# Patient Record
Sex: Male | Born: 1990 | Race: Black or African American | Hispanic: No | Marital: Single | Smoking: Never smoker
Health system: Southern US, Community
[De-identification: ages and names within clinical notes are randomized; demographics above are authoritative.]

---

## 2014-03-27 HISTORY — PX: KNEE CARTILAGE SURGERY: SHX688

## 2015-08-02 ENCOUNTER — Emergency Department (HOSPITAL_COMMUNITY)
Admission: EM | Admit: 2015-08-02 | Discharge: 2015-08-02 | Disposition: A | Discharge status: Home / Self Care | Payer: Commercial Managed Care - PPO | Attending: Emergency Medicine | Admitting: Emergency Medicine

## 2015-08-02 ENCOUNTER — Emergency Department (HOSPITAL_COMMUNITY): Payer: Commercial Managed Care - PPO

## 2015-08-02 ENCOUNTER — Encounter (HOSPITAL_COMMUNITY): Payer: Self-pay | Admitting: Emergency Medicine

## 2015-08-02 DIAGNOSIS — S3992XA Unspecified injury of lower back, initial encounter: Secondary | ICD-10-CM | POA: Insufficient documentation

## 2015-08-02 DIAGNOSIS — Y92410 Unspecified street and highway as the place of occurrence of the external cause: Secondary | ICD-10-CM | POA: Insufficient documentation

## 2015-08-02 DIAGNOSIS — M545 Low back pain, unspecified: Secondary | ICD-10-CM

## 2015-08-02 DIAGNOSIS — S29002A Unspecified injury of muscle and tendon of back wall of thorax, initial encounter: Secondary | ICD-10-CM | POA: Insufficient documentation

## 2015-08-02 DIAGNOSIS — Y9389 Activity, other specified: Secondary | ICD-10-CM | POA: Insufficient documentation

## 2015-08-02 DIAGNOSIS — Y998 Other external cause status: Secondary | ICD-10-CM | POA: Diagnosis not present

## 2015-08-02 MED ORDER — NAPROXEN 500 MG PO TABS: 500.0000 mg | ORAL_TABLET | Freq: Two times a day (BID) | ORAL | Status: DC

## 2015-08-02 MED ORDER — CYCLOBENZAPRINE HCL 10 MG PO TABS: 10.0000 mg | ORAL_TABLET | Freq: Two times a day (BID) | ORAL | Status: DC | PRN

## 2015-08-02 MED ORDER — IBUPROFEN 400 MG PO TABS
800.0000 mg | ORAL_TABLET | Freq: Once | ORAL | Status: AC
  Administered 2015-08-02: 800 mg via ORAL
  Filled 2015-08-02: qty 2
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
## 2015-08-02 NOTE — Discharge Instructions (Signed)
Do not take the muscle relaxant if driving as it will make you sleepy. Follow up with Dr. Roda ShuttersXu if symptoms persist.

## 2015-08-02 NOTE — ED Notes (Signed)
Patient verbalized understanding of discharge instructions and denies any further needs or questions at this time. VS stable. Patient ambulatory with steady gait.  
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
## 2020-09-01 ENCOUNTER — Ambulatory Visit
Admission: RE | Admit: 2020-09-01 | Discharge: 2020-09-01 | Disposition: A | Discharge status: Home / Self Care | Payer: Commercial Managed Care - PPO | Source: Ambulatory Visit | Attending: Student | Admitting: Student

## 2020-09-01 ENCOUNTER — Other Ambulatory Visit: Payer: Self-pay

## 2020-09-01 VITALS — BP 145/78 | HR 98 | Temp 98.7°F | Resp 16

## 2020-09-01 DIAGNOSIS — R369 Urethral discharge, unspecified: Secondary | ICD-10-CM | POA: Insufficient documentation

## 2020-09-01 DIAGNOSIS — Z113 Encounter for screening for infections with a predominantly sexual mode of transmission: Secondary | ICD-10-CM | POA: Insufficient documentation

## 2020-09-01 LAB — POCT URINALYSIS DIP (MANUAL ENTRY)
Bilirubin, UA: NEGATIVE
Blood, UA: NEGATIVE
Glucose, UA: NEGATIVE mg/dL
Ketones, POC UA: NEGATIVE mg/dL
Leukocytes, UA: NEGATIVE
Nitrite, UA: NEGATIVE
Protein Ur, POC: NEGATIVE mg/dL
Spec Grav, UA: >=1.03 — AB
Urobilinogen, UA: 0.2 U/dL
pH, UA: 6

## 2020-09-01 MED ORDER — CEFTRIAXONE SODIUM 500 MG IJ SOLR
500.0000 mg | Freq: Once | INTRAMUSCULAR | Status: AC
  Administered 2020-09-01: 500 mg via INTRAMUSCULAR

## 2020-09-01 MED ORDER — DOXYCYCLINE HYCLATE 100 MG PO CAPS: 100.0000 mg | ORAL_CAPSULE | Freq: Two times a day (BID) | ORAL | 0 refills | Status: AC

## 2020-09-01 NOTE — Discharge Instructions (Addendum)
-  Start the antibiotic, doxycycline twice daily for 7 days.  This is the treatment for chlamydia. -We also administered an antibiotic called Rocephin today that is the treatment for gonorrhea. -We are testing you for gonorrhea, chlamydia, trichomonas, HIV, syphilis.  If any of this is positive, we will call you and can send additional treatment if necessary. -Abstain for 7 days to ensure you are no longer contagious. -Seek additional immediate medical attention if you develop new symptoms like abdominal pain, back pain, fever/chills, worsening of the penile symptoms despite treatment.

## 2020-09-01 NOTE — ED Provider Notes (Addendum)
[facility]    CSN: [id number] Arrival date & time: 09/01/20  1641      History   Chief Complaint Chief Complaint  Patient presents with  . SEXUALLY TRANSMITTED DISEASE    HPI Tony Carrillo is a 30 y.o. male presenting with white penile discharge and dysuria x5 days.  Medical history penile discharge. Hx STI 2016 but he is unsure while one. Same male partner. Denies hematuria, frequency, urgency, back pain, n/v/d/abd pain, fevers/chills, abdnormal  Penile lesions, penile pain, testicular swelling/pain.   HPI  History reviewed. No pertinent past medical history.  There are no problems to display for this patient.   History reviewed. No pertinent surgical history.     Home Medications    Prior to Admission medications   Medication Sig Start Date End Date Taking? Authorizing Provider  doxycycline (VIBRAMYCIN) 100 MG capsule Take 1 capsule (100 mg total) by mouth 2 (two) times daily for 7 days. 09/01/20 09/08/20 Yes Rhys Martini, PA-C  cyclobenzaprine (FLEXERIL) 10 MG tablet Take 1 tablet (10 mg total) by mouth 2 (two) times daily as needed for muscle spasms. 08/02/15   Janne Napoleon, NP  naproxen (NAPROSYN) 500 MG tablet Take 1 tablet (500 mg total) by mouth 2 (two) times daily. 08/02/15   Janne Napoleon, NP    Family History History reviewed. No pertinent family history.  Social History Social History   Tobacco Use  . Smoking status: Never Smoker  Substance Use Topics  . Alcohol use: Yes    Comment: occ  . Drug use: No     Allergies   Patient has no known allergies.   Review of Systems Review of Systems  Constitutional: Negative for chills and fever.  HENT: Negative for sore throat.   Eyes: Negative for pain and redness.  Respiratory: Negative for shortness of breath.   Cardiovascular: Negative for chest pain.  Gastrointestinal: Negative for abdominal pain, diarrhea, nausea and vomiting.  Genitourinary: Positive for penile discharge. Negative  for decreased urine volume, difficulty urinating, dysuria, flank pain, frequency, genital sores, hematuria, penile pain, penile swelling, scrotal swelling, testicular pain and urgency.  Musculoskeletal: Negative for back pain.  Skin: Negative for rash.  All other systems reviewed and are negative.    Physical Exam Triage Vital Signs ED Triage Vitals  Enc Vitals Group     BP 09/01/20 1738 (!) 145/78     Pulse Rate 09/01/20 1738 98     Resp 09/01/20 1738 16     Temp 09/01/20 1738 98.7 F (37.1 C)     Temp Source 09/01/20 1738 Oral     SpO2 09/01/20 1738 98 %     Weight --      Height --      Head Circumference --      Peak Flow --      Pain Score 09/01/20 1713 0     Pain Loc --      Pain Edu? --      Excl. in GC? --    No data found.  Updated Vital Signs BP (!) 145/78 (BP Location: Right Arm)   Pulse 98   Temp 98.7 F (37.1 C) (Oral)   Resp 16   SpO2 98%   Visual Acuity Right Eye Distance:   Left Eye Distance:   Bilateral Distance:    Right Eye Near:   Left Eye Near:    Bilateral Near:     Physical Exam Vitals reviewed.  Constitutional:  General: He is not in acute distress.    Appearance: Normal appearance. He is not ill-appearing.  HENT:     Head: Normocephalic and atraumatic.     Mouth/Throat:     Mouth: Mucous membranes are moist.     Comments: Moist mucous membranes Eyes:     Extraocular Movements: Extraocular movements intact.     Pupils: Pupils are equal, round, and reactive to light.  Cardiovascular:     Rate and Rhythm: Normal rate and regular rhythm.     Heart sounds: Normal heart sounds.  Pulmonary:     Effort: Pulmonary effort is normal.     Breath sounds: Normal breath sounds. No wheezing, rhonchi or rales.  Abdominal:     General: Bowel sounds are normal. There is no distension.     Palpations: Abdomen is soft. There is no mass.     Tenderness: There is no abdominal tenderness. There is no right CVA tenderness, left CVA tenderness,  guarding or rebound.  Genitourinary:    Comments: deferred Skin:    General: Skin is warm.     Capillary Refill: Capillary refill takes less than 2 seconds.     Comments: Good skin turgor  Neurological:     General: No focal deficit present.     Mental Status: He is alert and oriented to person, place, and time.  Psychiatric:        Mood and Affect: Mood normal.        Behavior: Behavior normal.      UC Treatments / Results  Labs (all labs ordered are listed, but only abnormal results are displayed) Labs Reviewed  POCT URINALYSIS DIP (MANUAL ENTRY) - Abnormal; Notable for the following components:      Result Value   Spec Grav, UA >=1.030 (*)    All other components within normal limits  RPR  HIV ANTIBODY (ROUTINE TESTING W REFLEX)  CYTOLOGY, (ORAL, ANAL, URETHRAL) ANCILLARY ONLY    EKG   Radiology No results found.  Procedures Procedures (including critical care time)  Medications Ordered in UC Medications  cefTRIAXone (ROCEPHIN) injection 500 mg (has no administration in time range)    Initial Impression / Assessment and Plan / UC Course  I have reviewed the triage vital signs and the nursing notes.  Pertinent labs & imaging results that were available during my care of the patient were reviewed by me and considered in my medical decision making (see chart for details).     This patient is a 30 year old male presenting with penile discharge.  Afebrile, nontachycardic.  No abdominal pain or CVAT.  Self swab sent for gonorrhea, committee, trichomonas.  Planned to send for HIV, syphilis but were unable to collect this to patient veins.  Rocephin administered today.  Doxycycline sent.  safe sex precautions.  ED return precautions discussed.  Final Clinical Impressions(s) / UC Diagnoses   Final diagnoses:  Routine screening for STI (sexually transmitted infection)  Penile discharge     Discharge Instructions     -Start the antibiotic, doxycycline  twice daily for 7 days.  This is the treatment for chlamydia. -We also administered an antibiotic called Rocephin today that is the treatment for gonorrhea. -We are testing you for gonorrhea, chlamydia, trichomonas, HIV, syphilis.  If any of this is positive, we will call you and can send additional treatment if necessary. -Abstain for 7 days to ensure you are no longer contagious. -Seek additional immediate medical attention if you develop new symptoms like abdominal pain,  back pain, fever/chills, worsening of the penile symptoms despite treatment.    ED Prescriptions    Medication Sig Dispense Auth. Provider   doxycycline (VIBRAMYCIN) 100 MG capsule Take 1 capsule (100 mg total) by mouth 2 (two) times daily for 7 days. 14 capsule Rhys Martini, PA-C     PDMP not reviewed this encounter.   Rhys Martini, PA-C 09/01/20 Nelson Chimes, PA-C 09/01/20 [phone]

## 2020-09-01 NOTE — ED Triage Notes (Signed)
Pt having white discharge with some discomfort on the head of his penis. No burning with uriantion

## 2020-09-03 LAB — CYTOLOGY, (ORAL, ANAL, URETHRAL) ANCILLARY ONLY
Chlamydia: NEGATIVE
Comment: NEGATIVE
Comment: NEGATIVE
Comment: NORMAL
Neisseria Gonorrhea: NEGATIVE
Trichomonas: NEGATIVE

## 2020-09-21 ENCOUNTER — Telehealth: Payer: Self-pay | Admitting: Physician Assistant

## 2020-09-21 DIAGNOSIS — R369 Urethral discharge, unspecified: Secondary | ICD-10-CM

## 2020-09-21 MED ORDER — FLUCONAZOLE 150 MG PO TABS: 150.0000 mg | ORAL_TABLET | Freq: Once | ORAL | 0 refills | Status: AC

## 2020-09-21 NOTE — Progress Notes (Signed)
Virtual Visit Consent   Tony Carrillo, you are scheduled for a virtual visit with a [facility] provider today.     Just as with appointments in the office, your consent must be obtained to participate.  Your consent will be active for this visit and any virtual visit you may have with one of our providers in the next 365 days.     If you have a MyChart account, a copy of this consent can be sent to you electronically.  All virtual visits are billed to your insurance company just like a traditional visit in the office.    As this is a virtual visit, video technology does not allow for your provider to perform a traditional examination.  This may limit your provider's ability to fully assess your condition.  If your provider identifies any concerns that need to be evaluated in person or the need to arrange testing (such as labs, EKG, etc.), we will make arrangements to do so.     Although advances in technology are sophisticated, we cannot ensure that it will always work on either your end or our end.  If the connection with a video visit is poor, the visit may have to be switched to a telephone visit.  With either a video or telephone visit, we are not always able to ensure that we have a secure connection.     I need to obtain your verbal consent now.   Are you willing to proceed with your visit today?    [name] has provided verbal consent on 09/21/2020 for a virtual visit (video or telephone).   [name], [place]   Date: 09/21/2020 4:47 PM   Virtual Visit via Video Note   I, [name], connected with Gerritt Galentine ([id number], 1990-06-03) on 09/21/20 at  4:30 PM EDT by a video-enabled telemedicine application and verified that I am speaking with the correct person using two identifiers.  Location: Patient: Virtual Visit Location Patient: Home Provider: Virtual Visit Location Provider: Home Office   I discussed the limitations of evaluation and management by  telemedicine and the availability of in person appointments. The patient expressed understanding and agreed to proceed.    History of Present Illness: Tony Carrillo is a 30 y.o. who identifies as a male who was assigned male at birth, and is being seen today for penile discharge x 2 days. Patient states this initially occurred a few weeks ago. Began to develop a white discharge and itching around the tip of his penis. As such [facility] treated for suspect gonorrhea and chlamydia with IM Roecphin and a course of Doxycycline. UA was obtained and unremarkable for UTI. Penile swab was performed by patient (self-swab) and negative for gonorrhea, chlamydia and trichomonas. States symptoms seems to ease up and go away but have now recurred in the past couple of days. Denies fever, chills, malaise. Denies dysuria, urgency, frequency, pain, scrotal/testicular pain or swelling. Just itching at the tip of penis with mild redness and scant white discharge. Is unsure of what to do. He is sexually active with a monogamous male partner who has been asymptomatic. Notes she got tested after his symptoms started and she was negative for all STI. Is not active with any other individual. Is circumcised.   HPI: HPI  Problems: There are no problems to display for this patient.   Allergies: No Known Allergies Medications: No current outpatient medications on file.  Observations/Objective: Patient  is well-developed, well-nourished in no acute distress.  Resting comfortably at home.  Head is normocephalic, atraumatic.  No labored breathing. Speech is clear and coherent with logical content.  Patient is alert and oriented at baseline.  Unable to examine area due to video breaking up and lack of chaperone  Assessment and Plan: 1. Penile discharge Scant. White. Associated with itching and mild inflammation of glans. No other signs or symptoms present. Was treated empirically for gc/chlamydia  but all testing was negative. However this was self swab (confirmed by chart review). Giving descriptors or symptoms and negative prior testing this does raise concern for potential mild candidal balanitis. Will Rx diflucan. Discussed want him to get re-evaluation in person with repeat STI testing, performed by the clinician, just to verify no concern for false-negative initially.   Follow Up Instructions: I discussed the assessment and treatment plan with the patient. The patient was provided an opportunity to ask questions and all were answered. The patient agreed with the plan and demonstrated an understanding of the instructions.  A copy of instructions were sent to the patient via MyChart.  The patient was advised to call back or seek an in-person evaluation if the symptoms worsen or if the condition fails to improve as anticipated.  Time:  I spent 12 minutes with the patient via telehealth technology discussing the above problems/concerns.    [name], PA-C

## 2020-09-21 NOTE — Patient Instructions (Signed)
  Lenord Carbo, thank you for joining [name], PA-C for today's virtual visit.  While this provider is not your primary care provider (PCP), if your PCP is located in our provider database this encounter information will be shared with them immediately following your visit.  Consent: (Patient) Tony Carrillo provided verbal consent for this virtual visit at the beginning of the encounter.  Current Medications: No current outpatient medications on file.   Medications ordered in this encounter:  No orders of the defined types were placed in this encounter.    *If you need refills on other medications prior to your next appointment, please contact your pharmacy*  Follow-Up: Call back or seek an in-person evaluation if the symptoms worsen or if the condition fails to improve as anticipated.  Other Instructions Please take the Diflucan as directed for concerns of mild candidal balanitis (yeast infection). Keep skin clean and dry. Wear breathable underwear -- cotton.  I want you to follow-up at local urgent cares (see below) to get repeat testing (no self swabs) to truly rule out STI. Refrain from sexual activity until this is handled.    If you have been instructed to have an in-person evaluation today at a local Urgent Care facility, please use the link below. It will take you to a list of all of our available [facility], including address, phone number and hours of operation. Please do not delay care.  [facility]  If you or a family member do not have a primary care provider, use the link below to schedule a visit and establish care. When you choose a [facility] primary care physician or advanced practice provider, you gain a long-term partner in health. Find a Primary Care Provider  Learn more about [facility] in-office and virtual care options: [facility] - Get Care Now

## 2020-09-21 NOTE — Progress Notes (Signed)
Based on what you shared with me, I feel your condition warrants further evaluation and I recommend that you be seen in a face to face visit.  Giving these symptoms you need repeat evaluation and repeat testing to determine cause of symptoms so the proper treatment can be given. It is above the scope of what we are allowed to do via e-visit.    NOTE: There will be NO CHARGE for this eVisit   If you are having a true medical emergency please call 911.      For an urgent face to face visit, [facility] has six urgent care centers for your convenience:     [facility] at [facility] Directions [phone] [address] [place], [place] [address]    [facility] Health Urgent Care Center [facility]) Get Driving Directions [phone] [address] [place], [place] [address]  [facility] Urgent Care Center [facility]) Get Driving Directions [phone] [address] [place],  [place]  [address]  [facility] Urgent Care at [facility] Get Driving Directions [phone] [address] [place], [place] [address]   [facility] Urgent Care at [facility] Get Driving Directions  [phone] [address] [place], [place] [address]   [facility] Urgent Care at [facility] Directions [phone] [address] [place], [place] [address]  Your MyChart E-visit questionnaire answers were reviewed by a board certified advanced clinical practitioner to complete your personal care plan based on your specific symptoms.  Thank you for using e-Visits.

## 2020-09-24 ENCOUNTER — Ambulatory Visit
Admission: RE | Admit: 2020-09-24 | Discharge: 2020-09-24 | Disposition: A | Discharge status: Home / Self Care | Payer: Self-pay | Source: Ambulatory Visit | Attending: Family Medicine | Admitting: Family Medicine

## 2020-09-24 ENCOUNTER — Other Ambulatory Visit: Payer: Self-pay

## 2020-09-24 VITALS — BP 154/91 | HR 76 | Temp 98.0°F | Resp 18

## 2020-09-24 DIAGNOSIS — R369 Urethral discharge, unspecified: Secondary | ICD-10-CM

## 2020-09-24 DIAGNOSIS — N342 Other urethritis: Secondary | ICD-10-CM

## 2020-09-24 LAB — POCT URINALYSIS DIP (MANUAL ENTRY)
Bilirubin, UA: NEGATIVE
Glucose, UA: NEGATIVE mg/dL
Ketones, POC UA: NEGATIVE mg/dL
Nitrite, UA: NEGATIVE
Spec Grav, UA: 1.025 (ref 1.010–1.025)
Urobilinogen, UA: 0.2 U/dL
pH, UA: 7 (ref 5.0–8.0)

## 2020-09-24 MED ORDER — AZITHROMYCIN 500 MG PO TABS
ORAL_TABLET | ORAL | 0 refills | Status: DC
Start: 2020-09-24 — End: 2020-10-01

## 2020-09-24 NOTE — ED Triage Notes (Signed)
Patient c/o penile discharge x 4 day.    Patient endorses previous episode of symptoms 2 weeks ago, then symptoms subsided and then reoccurred after office visit at this clinic per patient statement.   Patient endorses "white" discharge. Patient endorses " a little itching".   Patient denies testicle pain and dysuria.   Patient endorses being treated for Gonorrhea 2 weeks ago and reports negative test results.

## 2020-09-24 NOTE — ED Provider Notes (Signed)
[facility]    CSN: [id number] Arrival date & time: 09/24/20  1648      History   Chief Complaint Chief Complaint  Patient presents with   Penile Discharge    HPI Tony Carrillo is a 30 y.o. male.   Reports whitish penile discharge for the last 4 days.  Reports that this happened about 2 weeks ago when he was seen in this office and empirically treated for STDs with Rocephin, doxycycline.  Reports that after the doxycycline he got little better and then came back.  Reports that he has since had a video visit and was diagnosed with yeast, was treated with Diflucan.  Reports that this did not make a difference.  Denies known STD exposure.  Denies foul odor, burning with urination, abdominal pain, flank pain, chills, fevers, other symptoms.  ROS per HPI  The history is provided by the patient.  Penile Discharge   History reviewed. No pertinent past medical history.  There are no problems to display for this patient.   Past Surgical History:  Procedure Laterality Date   KNEE CARTILAGE SURGERY Right 2008   KNEE CARTILAGE SURGERY Left 2016       Home Medications    Prior to Admission medications   Medication Sig Start Date End Date Taking? Authorizing Provider  azithromycin (ZITHROMAX) 500 MG tablet Take 2 tablets on day one, and one tablet for the next 4 days 09/24/20  Yes Moshe Cipro, NP    Family History History reviewed. No pertinent family history.  Social History Social History   Tobacco Use   Smoking status: Never    Passive exposure: Never   Smokeless tobacco: Never  Vaping Use   Vaping Use: Never used  Substance Use Topics   Alcohol use: Yes    Comment: occ   Drug use: No     Allergies   Patient has no known allergies.   Review of Systems Review of Systems  Genitourinary:  Positive for penile discharge.    Physical Exam Triage Vital Signs ED Triage Vitals  Enc Vitals Group     BP 09/24/20 1736 (!) 154/91     Pulse  Rate 09/24/20 1736 76     Resp 09/24/20 1736 18     Temp 09/24/20 1736 98 F (36.7 C)     Temp Source 09/24/20 1736 Oral     SpO2 09/24/20 1736 97 %     Weight --      Height --      Head Circumference --      Peak Flow --      Pain Score 09/24/20 1751 0     Pain Loc --      Pain Edu? --      Excl. in GC? --    No data found.  Updated Vital Signs BP (!) 154/91 (BP Location: Left Arm)   Pulse 76   Temp 98 F (36.7 C) (Oral)   Resp 18   SpO2 97%   Visual Acuity Right Eye Distance:   Left Eye Distance:   Bilateral Distance:    Right Eye Near:   Left Eye Near:    Bilateral Near:     Physical Exam Vitals and nursing note reviewed.  Constitutional:      Appearance: Normal appearance. He is well-developed.  HENT:     Head: Normocephalic and atraumatic.     Nose: Nose normal.     Mouth/Throat:     Mouth: Mucous membranes are  moist.     Pharynx: Oropharynx is clear.  Eyes:     Extraocular Movements: Extraocular movements intact.     Conjunctiva/sclera: Conjunctivae normal.     Pupils: Pupils are equal, round, and reactive to light.  Cardiovascular:     Rate and Rhythm: Normal rate and regular rhythm.  Pulmonary:     Effort: Pulmonary effort is normal. No respiratory distress.  Genitourinary:    Comments: Declines Musculoskeletal:        General: Normal range of motion.     Cervical back: Normal range of motion and neck supple.  Skin:    General: Skin is warm and dry.     Capillary Refill: Capillary refill takes less than 2 seconds.  Neurological:     General: No focal deficit present.     Mental Status: He is alert and oriented to person, place, and time.  Psychiatric:        Mood and Affect: Mood normal.        Behavior: Behavior normal.        Thought Content: Thought content normal.     UC Treatments / Results  Labs (all labs ordered are listed, but only abnormal results are displayed) Labs Reviewed  POCT URINALYSIS DIP (MANUAL ENTRY) - Abnormal;  Notable for the following components:      Result Value   Blood, UA trace-intact (*)    Protein Ur, POC trace (*)    Leukocytes, UA Trace (*)    All other components within normal limits  CYTOLOGY, (ORAL, ANAL, URETHRAL) ANCILLARY ONLY    EKG   Radiology No results found.  Procedures Procedures (including critical care time)  Medications Ordered in UC Medications - No data to display  Initial Impression / Assessment and Plan / UC Course  I have reviewed the triage vital signs and the nursing notes.  Pertinent labs & imaging results that were available during my care of the patient were reviewed by me and considered in my medical decision making (see chart for details).    Urethritis Penile discharge  Azithromycin prescribed, possibly resistant nongonococcal urethritis UA negative for infection today Cytology swab obtained Will be in touch with any abnormal results that require further treatment Testing was negative at last visit, so doubt this is STI related Follow up with this office or with primary care if symptoms are persisting.  Follow up in the ER for high fever, trouble swallowing, trouble breathing, other concerning symptoms.   Final Clinical Impressions(s) / UC Diagnoses   Final diagnoses:  Penile discharge  Urethritis     Discharge Instructions      Azithromycin prescribed   Take as directed and to completion  Swab results will be back in a few days, we will be in touch with any abnormal results that require further treatment  Follow up with this office or with primary care if symptoms are persisting.  Follow up in the ER for high fever, trouble swallowing, trouble breathing, other concerning symptoms.      ED Prescriptions     Medication Sig Dispense Auth. Provider   azithromycin (ZITHROMAX) 500 MG tablet Take 2 tablets on day one, and one tablet for the next 4 days 6 tablet Moshe Cipro, NP      PDMP not reviewed this  encounter.   Moshe Cipro, NP 09/24/20 1946

## 2020-09-24 NOTE — Discharge Instructions (Addendum)
Azithromycin prescribed   Take as directed and to completion  Swab results will be back in a few days, we will be in touch with any abnormal results that require further treatment  Follow up with this office or with primary care if symptoms are persisting.  Follow up in the ER for high fever, trouble swallowing, trouble breathing, other concerning symptoms.

## 2020-09-28 LAB — CYTOLOGY, (ORAL, ANAL, URETHRAL) ANCILLARY ONLY
Chlamydia: NEGATIVE
Comment: NEGATIVE
Comment: NEGATIVE
Comment: NORMAL
Neisseria Gonorrhea: NEGATIVE
Trichomonas: NEGATIVE

## 2020-10-01 ENCOUNTER — Telehealth: Payer: Self-pay | Admitting: Physician Assistant

## 2020-10-01 DIAGNOSIS — N342 Other urethritis: Secondary | ICD-10-CM

## 2020-10-01 MED ORDER — SULFAMETHOXAZOLE-TRIMETHOPRIM 800-160 MG PO TABS: 1.0000 | ORAL_TABLET | Freq: Two times a day (BID) | ORAL | 0 refills | Status: DC

## 2020-10-01 NOTE — Progress Notes (Signed)
E-Visit for Urinary Problems  We are sorry that you are not feeling well.  Here is how we plan to help!  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 We do not normally treat male UTI symptoms, but since you have had testing I will treat as below. If symptoms do not improve, you will need to be seen in person again. I would ask for them to send a culture of the discharge or urine if it does not improve with this antibiotic.   A UTI (Urinary Tract Infection) is a bacterial infection of the bladder.  Most cases of urinary tract infections are simple to treat but a key part of your care is to encourage you to drink plenty of fluids and watch your symptoms carefully.  I have prescribed Bactrim DS take one tablet twice daily x 10 days.  Your symptoms should gradually improve. Call us if the burning in your urine worsens, you develop worsening fever, back pain or pelvic pain or if your symptoms do not resolve after completing the antibiotic.  Urinary tract infections can be prevented by drinking plenty of water to keep your body hydrated.  Also be sure when you wipe, wipe from front to back and don't hold it in!  If possible, empty your bladder every 4 hours.  HOME CARE Drink plenty of fluids Compete the full course of the antibiotics even if the symptoms resolve Remember, when you need to go.go. Holding in your urine can increase the likelihood of getting a UTI! GET HELP RIGHT AWAY IF: You cannot urinate You get a high fever Worsening back pain occurs You see blood in your urine You feel sick to your stomach or throw up You feel like you are going to pass out  MAKE SURE YOU  Understand these instructions. Will watch your condition. Will get help right away if you are not doing well or get worse.   Thank you for choosing an e-visit.  Your e-visit answers were reviewed by a board certified advanced clinical practitioner to complete your personal  care plan. Depending upon the condition, your plan could have included both over the counter or prescription medications.  Please review your pharmacy choice. Make sure the pharmacy is open so you can pick up prescription now. If there is a problem, you may contact your provider through [employer] and have the prescription routed to another pharmacy.  Your safety is important to [place]. If you have drug allergies check your prescription carefully.   For the next 24 hours you can use MyChart to ask questions about today's visit, request a non-urgent call back, or ask for a work or school excuse. You will get an email in the next two days asking about your experience. I hope that your e-visit has been valuable and will speed your recovery.  I provided 9 minutes of non face-to-face time during this encounter for chart review and documentation.

## 2020-10-14 ENCOUNTER — Telehealth: Payer: Self-pay

## 2020-10-22 ENCOUNTER — Telehealth: Payer: Self-pay | Admitting: Nurse Practitioner

## 2020-10-22 ENCOUNTER — Encounter: Payer: Self-pay | Admitting: Nurse Practitioner

## 2020-10-22 DIAGNOSIS — R369 Urethral discharge, unspecified: Secondary | ICD-10-CM

## 2020-10-22 NOTE — Progress Notes (Signed)
Virtual Visit Consent   Tony Carrillo, you are scheduled for a virtual visit with Tony Daphine Deutscher, FNP, a [facility] provider, today.     Just as with appointments in the office, your consent must be obtained to participate.  Your consent will be active for this visit and any virtual visit you may have with one of our providers in the next 365 days.     If you have a MyChart account, a copy of this consent can be sent to you electronically.  All virtual visits are billed to your insurance company just like a traditional visit in the office.    As this is a virtual visit, video technology does not allow for your provider to perform a traditional examination.  This may limit your provider's ability to fully assess your condition.  If your provider identifies any concerns that need to be evaluated in person or the need to arrange testing (such as labs, EKG, etc.), we will make arrangements to do so.     Although advances in technology are sophisticated, we cannot ensure that it will always work on either your end or our end.  If the connection with a video visit is poor, the visit may have to be switched to a telephone visit.  With either a video or telephone visit, we are not always able to ensure that we have a secure connection.     I need to obtain your verbal consent now.   Are you willing to proceed with your visit today? YES   Tony Carrillo has provided verbal consent on 10/22/2020 for a virtual visit (video or telephone).   Tony Daphine Deutscher, FNP   Date: 10/22/2020 5:43 PM   Virtual Visit via Video Note   I, Tony Carrillo, connected with Tony Carrillo ([id number], 1991/03/05) on 10/22/20 at  4:30 PM EDT by a video-enabled telemedicine application and verified that I am speaking with the correct person using two identifiers.  Location: Patient: Virtual Visit Location Patient: Home Provider: Virtual Visit Location Provider: Mobile   I discussed the limitations of  evaluation and management by telemedicine and the availability of in person appointments. The patient expressed understanding and agreed to proceed.    History of Present Illness: Tony Carrillo is a 30 y.o. who identifies as a male who was assigned male at birth, and is being seen today for penile discharge.  HPI: Patient is c/o penile discharge. He has seen several providers and has been on several antibiotics and has had several std CHECKS. THE DISCHARGE IS ONLY IN THE MORNINGS. Not much discharge during the day. Has burning or itching after he uses the restroom. This has been going on since 09/21/20. His G/C test have come back negative.    Problems: There are no problems to display for this patient.   Allergies: No Known Allergies Medications:  Current Outpatient Medications:    sulfamethoxazole-trimethoprim (BACTRIM DS) 800-160 MG tablet, Take 1 tablet by mouth 2 (two) times daily., Disp: 20 tablet, Rfl: 0  Observations/Objective: Patient is well-developed, well-nourished in no acute distress.  Resting comfortably at home.  Head is normocephalic, atraumatic.  No labored breathing. Speech is clear and coherent with logical content.  Patient is alert and oriented at baseline.   Assessment and Plan:  Tony Carrillo in today with chief complaint of No chief complaint on file.   1. Penile discharge Needs to see urologist- but has no insurance. Go to health department since has no insurance.   Follow Up  Instructions: I discussed the assessment and treatment plan with the patient. The patient was provided an opportunity to ask questions and all were answered. The patient agreed with the plan and demonstrated an understanding of the instructions.  A copy of instructions were sent to the patient via MyChart.  The patient was advised to call back or seek an in-person evaluation if the symptoms worsen or if the condition fails to improve as anticipated.  Time:  I spent 10 minutes with  the patient via telehealth technology discussing the above problems/concerns.    Tony Daphine Deutscher, FNP

## 2021-02-09 ENCOUNTER — Ambulatory Visit
Admission: EM | Admit: 2021-02-09 | Discharge: 2021-02-09 | Disposition: A | Discharge status: Home / Self Care | Payer: Self-pay | Attending: Physician Assistant | Admitting: Physician Assistant

## 2021-02-09 ENCOUNTER — Other Ambulatory Visit: Payer: Self-pay

## 2021-02-09 DIAGNOSIS — S61212A Laceration without foreign body of right middle finger without damage to nail, initial encounter: Secondary | ICD-10-CM

## 2021-02-09 DIAGNOSIS — Z23 Encounter for immunization: Secondary | ICD-10-CM

## 2021-02-09 MED ORDER — TETANUS-DIPHTH-ACELL PERTUSSIS 5-2.5-18.5 LF-MCG/0.5 IM SUSY
0.5000 mL | PREFILLED_SYRINGE | Freq: Once | INTRAMUSCULAR | Status: AC
  Administered 2021-02-09: 0.5 mL via INTRAMUSCULAR

## 2021-02-09 NOTE — ED Provider Notes (Signed)
[facility]    CSN: [id number] Arrival date & time: 02/09/21  0904      History   Chief Complaint Chief Complaint  Patient presents with   Laceration    HPI Tony Carrillo is a 30 y.o. male.   Patient here today for evaluation of a laceration he sustained to his right middle finger last night when he punched through glass.  He states that bleeding has improved significantly but he still has some minor bleeding at times.  He denies any numbness.  He is able to move joints of his middle finger without difficulty.  He cannot recall his last tetanus vaccination.  The history is provided by the patient.  Laceration Associated symptoms: no fever    History reviewed. No pertinent past medical history.  There are no problems to display for this patient.   Past Surgical History:  Procedure Laterality Date   KNEE CARTILAGE SURGERY Right 2008   KNEE CARTILAGE SURGERY Left 2016       Home Medications    Prior to Admission medications   Not on File    Family History History reviewed. No pertinent family history.  Social History Social History   Tobacco Use   Smoking status: Never    Passive exposure: Never   Smokeless tobacco: Never  Vaping Use   Vaping Use: Never used  Substance Use Topics   Alcohol use: Yes    Comment: occ   Drug use: No     Allergies   Patient has no known allergies.   Review of Systems Review of Systems  Constitutional:  Negative for chills and fever.  Eyes:  Negative for discharge and redness.  Respiratory:  Negative for shortness of breath.   Musculoskeletal:  Negative for arthralgias and joint swelling.  Skin:  Positive for color change and wound.  Neurological:  Negative for numbness.    Physical Exam Triage Vital Signs ED Triage Vitals  Enc Vitals Group     BP 02/09/21 1045 (!) 161/95     Pulse Rate 02/09/21 1045 (!) 101     Resp 02/09/21 1045 18     Temp 02/09/21 1045 98 F (36.7 C)     Temp Source  02/09/21 1045 Oral     SpO2 02/09/21 1045 98 %     Weight --      Height --      Head Circumference --      Peak Flow --      Pain Score 02/09/21 1046 1     Pain Loc --      Pain Edu? --      Excl. in GC? --    No data found.  Updated Vital Signs BP (!) 161/95 (BP Location: Left Arm)   Pulse (!) 101   Temp 98 F (36.7 C) (Oral)   Resp 18   SpO2 98%     Physical Exam Vitals and nursing note reviewed.  Constitutional:      General: He is not in acute distress.    Appearance: Normal appearance. He is not ill-appearing.  HENT:     Head: Normocephalic and atraumatic.  Eyes:     Conjunctiva/sclera: Conjunctivae normal.  Cardiovascular:     Rate and Rhythm: Normal rate.  Pulmonary:     Effort: Pulmonary effort is normal.  Musculoskeletal:     Comments: Full ROM of right 3rd finger  Skin:    Capillary Refill: Normal cap refill to right distal 3rd finger  Comments: ~ 3 cm "J" shaped laceration to right lateral middle finger with minimal bleeding  Neurological:     Mental Status: He is alert.     Comments: Gross sensation intact to distal right middle finger  Psychiatric:        Mood and Affect: Mood normal.        Behavior: Behavior normal.        Thought Content: Thought content normal.     UC Treatments / Results  Labs (all labs ordered are listed, but only abnormal results are displayed) Labs Reviewed - No data to display  EKG   Radiology No results found.  Procedures Laceration Repair  Date/Time: 02/09/2021 12:11 PM Performed by: Tomi Bamberger, PA-C Authorized by: Tomi Bamberger, PA-C   Consent:    Consent obtained:  Verbal   Consent given by:  Patient   Risks, benefits, and alternatives were discussed: yes     Risks discussed:  Infection and need for additional repair   Alternatives discussed:  Delayed treatment Universal protocol:    Procedure explained and questions answered to patient or proxy's satisfaction: yes     Relevant documents  present and verified: yes     Required blood products, implants, devices, and special equipment available: yes     Patient identity confirmed:  Provided demographic data Anesthesia:    Anesthesia method:  None Laceration details:    Location:  Finger   Finger location:  R long finger   Length (cm):  3   Depth (mm):  1 Exploration:    Limited defect created (wound extended): no     Imaging outcome: foreign body not noted     Contaminated: no   Treatment:    Area cleansed with:  Chlorhexidine   Amount of cleaning:  Standard Skin repair:    Repair method:  Tissue adhesive Approximation:    Approximation:  Loose Repair type:    Repair type:  Simple Post-procedure details:    Dressing:  Open (no dressing)   Procedure completion:  Tolerated well, no immediate complications (including critical care time)  Medications Ordered in UC Medications  Tdap (BOOSTRIX) injection 0.5 mL (0.5 mLs Intramuscular Given 02/09/21 1109)    Initial Impression / Assessment and Plan / UC Course  I have reviewed the triage vital signs and the nursing notes.  Pertinent labs & imaging results that were available during my care of the patient were reviewed by me and considered in my medical decision making (see chart for details).   Laceration repaired in office. Recommended he monitor and follow up with any signs of infection, further bleeding, etc. Tdap administered in office today. Encouraged follow up with any further concerns.   Final Clinical Impressions(s) / UC Diagnoses   Final diagnoses:  Laceration of right middle finger without foreign body without damage to nail, initial encounter   Discharge Instructions   None    ED Prescriptions   None    PDMP not reviewed this encounter.   Tomi Bamberger, PA-C 02/09/21 1213

## 2021-02-09 NOTE — ED Triage Notes (Signed)
Pt states punched glass last night and cut his rt index finger. Lac noted lateral mid rt index finger with bleeding controlled.
